# Patient Record
Sex: Male | Born: 2000 | Hispanic: Yes | Marital: Single | Smoking: Never smoker
Health system: Southern US, Community
[De-identification: ages and names within clinical notes are randomized; demographics above are authoritative.]

## PROBLEM LIST (undated history)

## (undated) DIAGNOSIS — J302 Other seasonal allergic rhinitis: Secondary | ICD-10-CM

## (undated) DIAGNOSIS — J45909 Unspecified asthma, uncomplicated: Secondary | ICD-10-CM

## (undated) HISTORY — PX: TESTICLE SURGERY: SHX794

## (undated) HISTORY — PX: LEG SURGERY: SHX1003

---
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
## 2020-05-14 IMAGING — DX DG CHEST 2V
2 series · 2 of 2 positions shown · non-contrast
Comparison: December 27, 2015

CLINICAL DATA: Chest pain

EXAM:
CHEST - 2 VIEW

[chest pa]
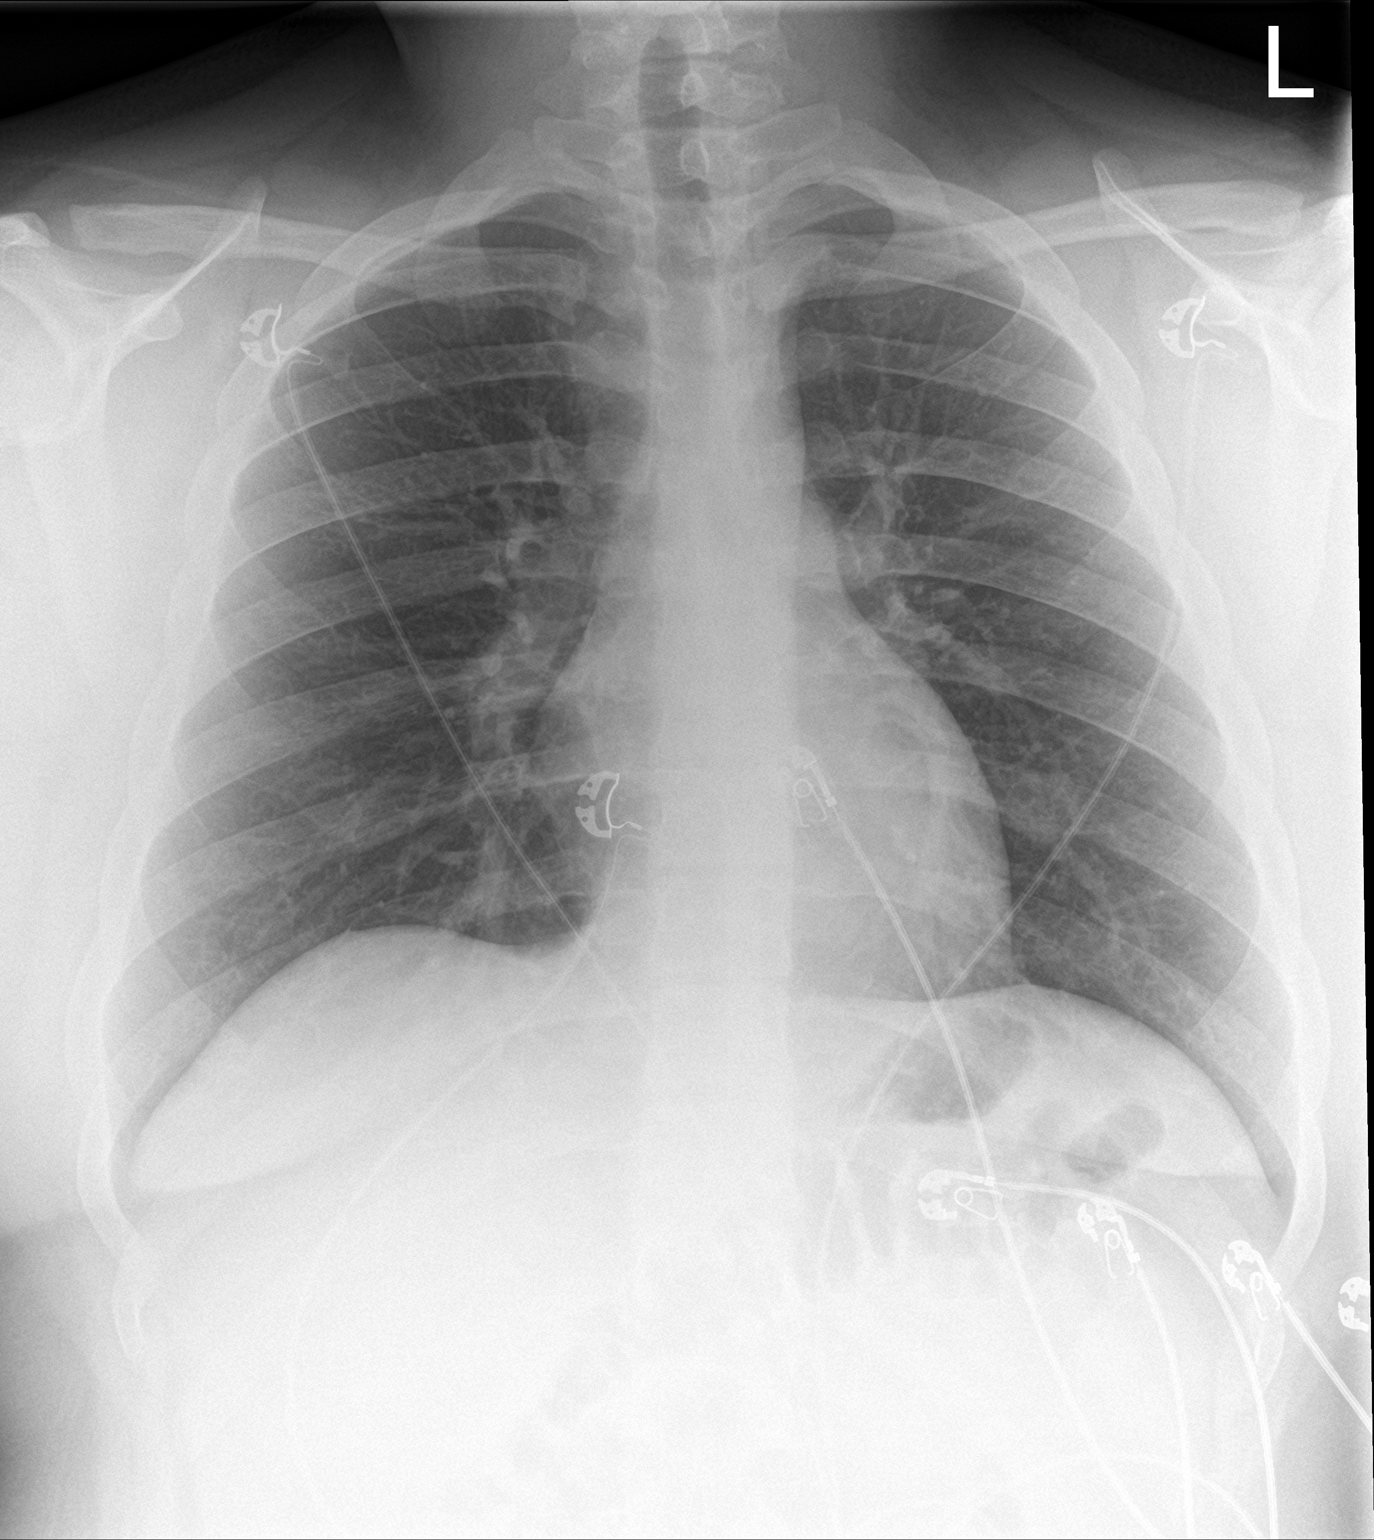

[chest lat]
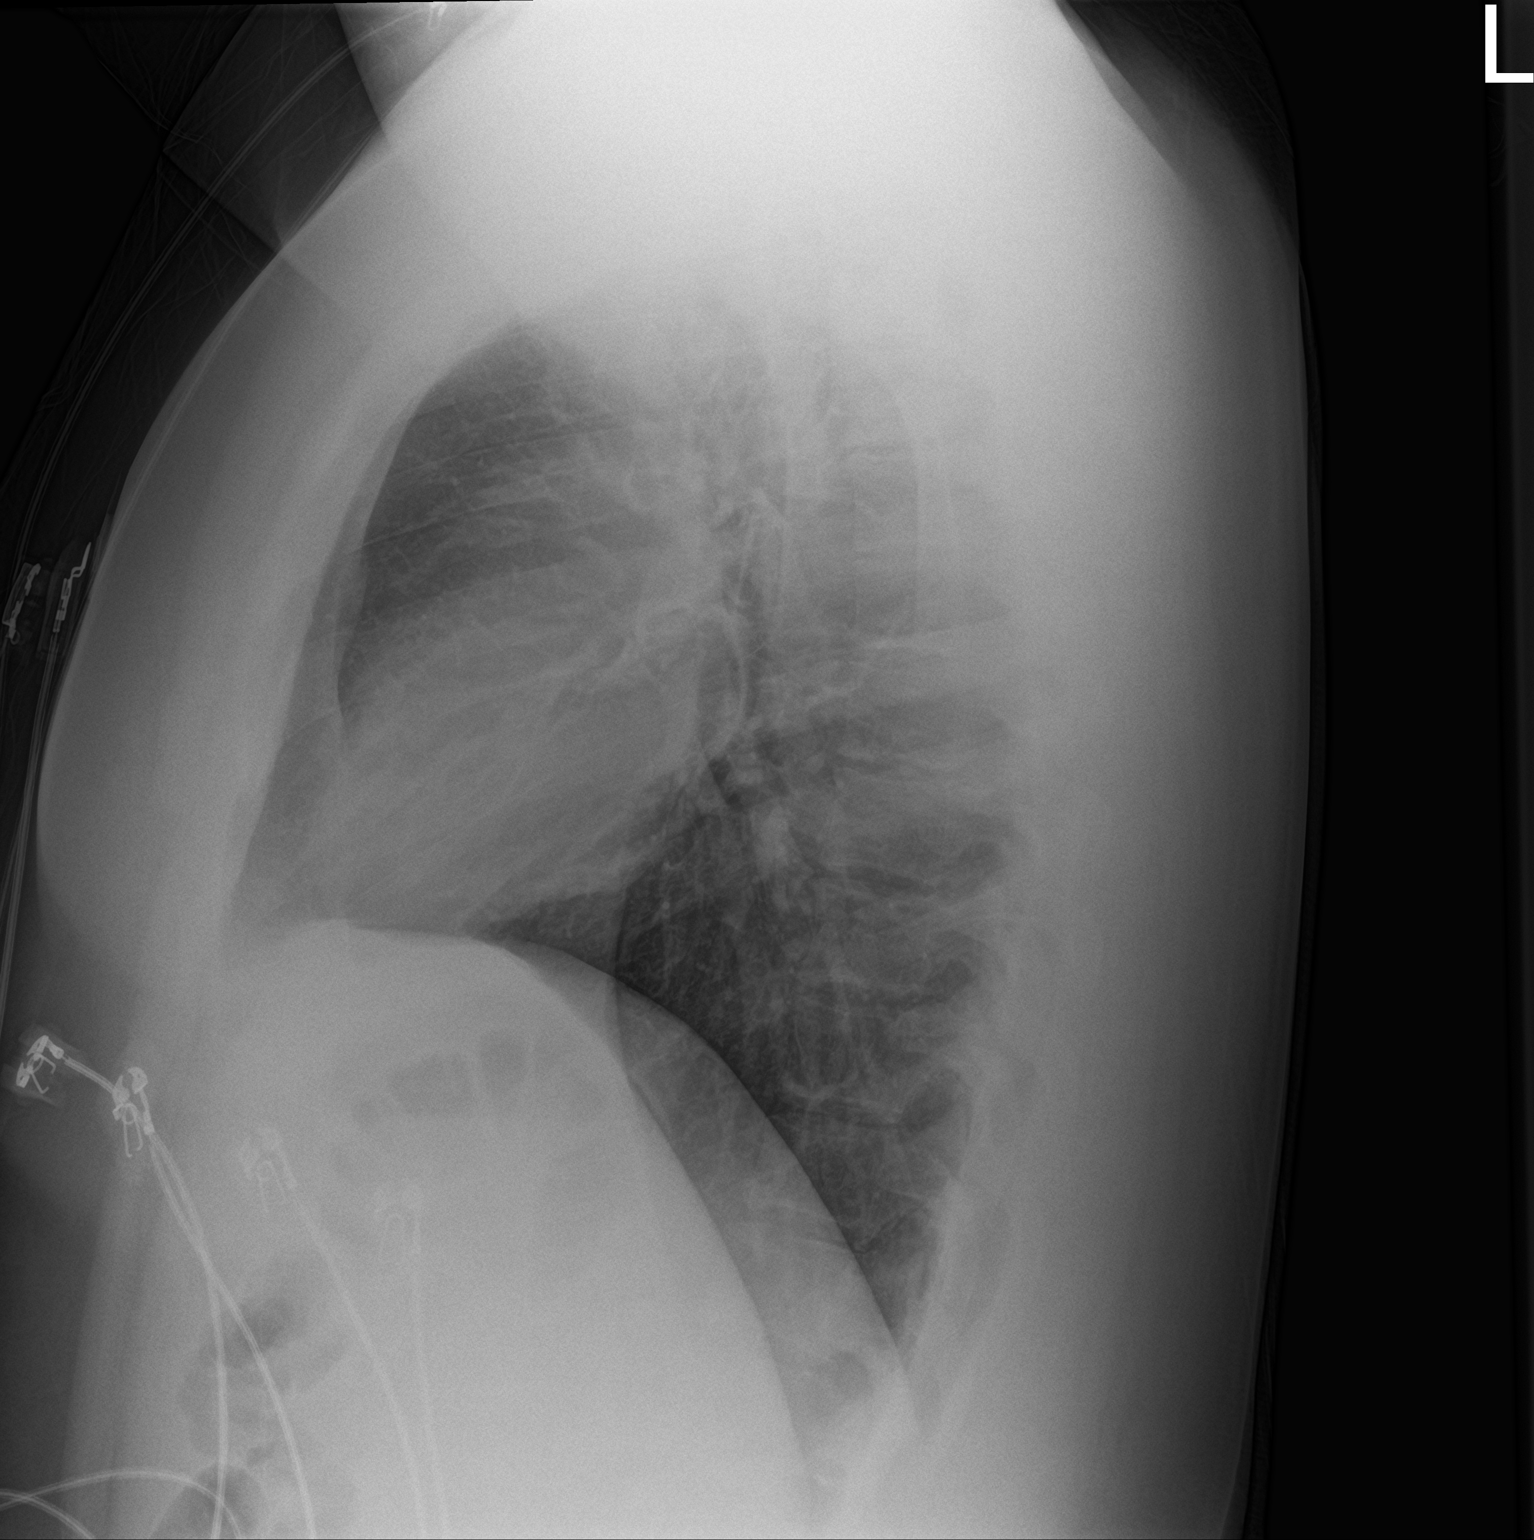

[2 of 2 positions shown; findings below may reference images not displayed]

FINDINGS: Lungs are clear. The heart size and pulmonary vascularity are
normal. No adenopathy. No pneumothorax. No bone lesions.
IMPRESSION: No edema or consolidation.

## 2022-07-06 ENCOUNTER — Emergency Department (HOSPITAL_BASED_OUTPATIENT_CLINIC_OR_DEPARTMENT_OTHER): Payer: Self-pay

## 2022-07-06 ENCOUNTER — Encounter (HOSPITAL_BASED_OUTPATIENT_CLINIC_OR_DEPARTMENT_OTHER): Payer: Self-pay

## 2022-07-06 ENCOUNTER — Emergency Department (HOSPITAL_BASED_OUTPATIENT_CLINIC_OR_DEPARTMENT_OTHER)
Admission: EM | Admit: 2022-07-06 | Discharge: 2022-07-06 | Disposition: A | Discharge status: Home / Self Care | Payer: Self-pay | Attending: Emergency Medicine | Admitting: Emergency Medicine

## 2022-07-06 ENCOUNTER — Other Ambulatory Visit: Payer: Self-pay

## 2022-07-06 DIAGNOSIS — R748 Abnormal levels of other serum enzymes: Secondary | ICD-10-CM | POA: Insufficient documentation

## 2022-07-06 DIAGNOSIS — R1013 Epigastric pain: Secondary | ICD-10-CM | POA: Insufficient documentation

## 2022-07-06 LAB — COMPREHENSIVE METABOLIC PANEL
ALT: 49 U/L — ABNORMAL HIGH (ref 0–44)
AST: 32 U/L (ref 15–41)
Albumin: 4.3 g/dL (ref 3.5–5.0)
Alkaline Phosphatase: 103 U/L (ref 38–126)
Anion gap: 8 (ref 5–15)
BUN: 12 mg/dL (ref 6–20)
CO2: 27 mmol/L (ref 22–32)
Calcium: 9.1 mg/dL (ref 8.9–10.3)
Chloride: 103 mmol/L (ref 98–111)
Creatinine, Ser: 0.76 mg/dL (ref 0.61–1.24)
GFR, Estimated: >60 mL/min (ref >60)
Glucose, Bld: 111 mg/dL — ABNORMAL HIGH (ref 70–99)
Potassium: 3.8 mmol/L (ref 3.5–5.1)
Sodium: 138 mmol/L (ref 135–145)
Total Bilirubin: 0.7 mg/dL (ref 0.3–1.2)
Total Protein: 8 g/dL (ref 6.5–8.1)

## 2022-07-06 LAB — CBC WITH DIFFERENTIAL/PLATELET
Abs Immature Granulocytes: 0.02 10*3/uL (ref 0.00–0.07)
Basophils Absolute: 0 10*3/uL (ref 0.0–0.1)
Basophils Relative: 1 %
Eosinophils Absolute: 0.2 10*3/uL (ref 0.0–0.5)
Eosinophils Relative: 2 %
HCT: 46.6 % (ref 39.0–52.0)
Hemoglobin: 15.4 g/dL (ref 13.0–17.0)
Immature Granulocytes: 0 %
Lymphocytes Relative: 41 %
Lymphs Abs: 3.5 10*3/uL (ref 0.7–4.0)
MCH: 27.5 pg (ref 26.0–34.0)
MCHC: 33 g/dL (ref 30.0–36.0)
MCV: 83.1 fL (ref 80.0–100.0)
Monocytes Absolute: 0.4 10*3/uL (ref 0.1–1.0)
Monocytes Relative: 5 %
Neutro Abs: 4.4 10*3/uL (ref 1.7–7.7)
Neutrophils Relative %: 51 %
Platelets: 333 10*3/uL (ref 150–400)
RBC: 5.61 MIL/uL (ref 4.22–5.81)
RDW: 13.1 % (ref 11.5–15.5)
WBC: 8.6 10*3/uL (ref 4.0–10.5)
nRBC: 0 % (ref 0.0–0.2)

## 2022-07-06 LAB — URINALYSIS, ROUTINE W REFLEX MICROSCOPIC
Bilirubin Urine: NEGATIVE
Glucose, UA: NEGATIVE mg/dL
Hgb urine dipstick: NEGATIVE
Ketones, ur: NEGATIVE mg/dL
Leukocytes,Ua: NEGATIVE
Nitrite: NEGATIVE
Protein, ur: NEGATIVE mg/dL
Specific Gravity, Urine: 1.025 (ref 1.005–1.030)
pH: 7 (ref 5.0–8.0)

## 2022-07-06 LAB — LIPASE, BLOOD: Lipase: 290 U/L — ABNORMAL HIGH (ref 11–51)

## 2022-07-06 MED ORDER — LIDOCAINE VISCOUS HCL 2 % MT SOLN
15.0000 mL | Freq: Once | OROMUCOSAL | Status: AC
  Administered 2022-07-06: 15 mL via ORAL
  Filled 2022-07-06: qty 15

## 2022-07-06 MED ORDER — PANTOPRAZOLE SODIUM 20 MG PO TBEC: 20.0000 mg | DELAYED_RELEASE_TABLET | Freq: Every day | ORAL | 0 refills | Status: AC

## 2022-07-06 MED ORDER — IOHEXOL 300 MG/ML  SOLN
100.0000 mL | Freq: Once | INTRAMUSCULAR | Status: AC | PRN
  Administered 2022-07-06: 100 mL via INTRAVENOUS

## 2022-07-06 MED ORDER — LIDOCAINE VISCOUS HCL 2 % MT SOLN: 15.0000 mL | OROMUCOSAL | 0 refills | Status: AC | PRN

## 2022-07-06 MED ORDER — PANTOPRAZOLE SODIUM 40 MG IV SOLR
40.0000 mg | Freq: Once | INTRAVENOUS | Status: AC
  Administered 2022-07-06: 40 mg via INTRAVENOUS
  Filled 2022-07-06: qty 10

## 2022-07-06 MED ORDER — ALUM & MAG HYDROXIDE-SIMETH 200-200-20 MG/5ML PO SUSP
30.0000 mL | Freq: Once | ORAL | Status: AC
  Administered 2022-07-06: 30 mL via ORAL
  Filled 2022-07-06: qty 30

## 2022-07-06 MED ORDER — SUCRALFATE 1 G PO TABS: 1.0000 g | ORAL_TABLET | Freq: Three times a day (TID) | ORAL | 0 refills | Status: AC

## 2022-07-06 NOTE — ED Triage Notes (Signed)
Reports upper abd pain and indigestion that started last night. Denies n/v.  Reports he got dizzy when it started last night. Denies radiation.

## 2022-07-06 NOTE — ED Notes (Signed)
PO challenge complete patient keep fluids down

## 2022-07-06 NOTE — Discharge Instructions (Addendum)
Gentle diet over the next few days. Start taking the medications as prescribed  Stop taking the Ibuprofen  On the back of the discharge paperwork there is information to establish care with a primary care provider   Follow up with PCP  Return for new or worsening symptom

## 2022-07-06 NOTE — ED Notes (Signed)
Having upper abdominal pain since last night.Denies any n/v. States that he has hemorrhoids States that he has acid reflux sometime .

## 2022-07-06 NOTE — ED Provider Notes (Signed)
[facility] Provider Note   CSN: [id number] Arrival date & time: 07/06/22  1154    History  Chief Complaint  Patient presents with   Abdominal Pain   Heartburn    Walter Jackson is a 21 y.o. male for evaluation of abdominal pain.  Located epigastric region.  Some belching acid reflux.  Chest pain or shortness of breath.  Does state that he uses ibuprofen pretty frequently.  He has known history of hemorrhoids.  No lower abdominal pain.  No fever, vomiting, chest pain, shortness of breath, back pain, no alcohol use.  Does state he has some frequent constipation and when he strains he will have some bright red blood on the toilet paper.  States has been ongoing for quite some time.  No increase in bleeding.  No meds PTA. No prior abd surgeries.  Never had anything like this before.  No pain worse with p.o. intake, no right upper quadrant pain.  HPI     Home Medications Prior to Admission medications   Medication Sig Start Date End Date Taking? Authorizing Provider  lidocaine (XYLOCAINE) 2 % solution Use as directed 15 mLs in the mouth or throat as needed for mouth pain. 07/06/22  Yes Cassia Fein A, PA-C  pantoprazole (PROTONIX) 20 MG tablet Take 1 tablet (20 mg total) by mouth daily. 07/06/22  Yes Josuel Koeppen A, PA-C  sucralfate (CARAFATE) 1 g tablet Take 1 tablet (1 g total) by mouth 4 (four) times daily -  with meals and at bedtime. 07/06/22 08/05/22 Yes Runa Whittingham A, PA-C  albuterol (PROVENTIL HFA;VENTOLIN HFA) 108 (90 Base) MCG/ACT inhaler Inhale 1-2 puffs into the lungs every 6 (six) hours as needed for wheezing or shortness of breath.    [provider]  albuterol (PROVENTIL) (2.5 MG/3ML) 0.083% nebulizer solution Take 2.5 mg by nebulization every 6 (six) hours as needed for wheezing or shortness of breath.    [provider]  cetirizine (ZYRTEC) 10 MG tablet Take 10 mg by mouth daily.    [provider]   doxycycline (VIBRAMYCIN) 100 MG capsule Take 1 capsule (100 mg total) by mouth 2 (two) times daily. 06/05/17   Mikell, Jeani Sow, MD  famotidine (PEPCID) 20 MG tablet Take 1 tablet (20 mg total) by mouth 2 (two) times daily. 09/05/18   Willadean Carol, MD  fluticasone-salmeterol (ADVAIR HFA) [phone] MCG/ACT inhaler Inhale 2 puffs into the lungs 2 (two) times daily as needed (for shortness of breath).     [provider]  guaiFENesin (ROBITUSSIN) 100 MG/5ML liquid Take 5-10 mLs (100-200 mg total) by mouth every 4 (four) hours as needed for cough. 12/27/15   Clayton Bibles, PA-C  ibuprofen (ADVIL,MOTRIN) 600 MG tablet Take 1 tablet (600 mg total) by mouth 3 (three) times daily. 09/05/18   Willadean Carol, MD  ondansetron (ZOFRAN) 4 MG tablet Take 1 tablet (4 mg total) by mouth every 8 (eight) hours as needed for nausea or vomiting. 12/27/15   Clayton Bibles, PA-C      Allergies    Patient has no known allergies.    Review of Systems   Review of Systems  Constitutional: Negative.   HENT: Negative.    Respiratory: Negative.    Cardiovascular: Negative.   Gastrointestinal:  Positive for abdominal pain and nausea. Negative for abdominal distention, anal bleeding, blood in stool, constipation, diarrhea, rectal pain and vomiting.  Genitourinary: Negative.   Musculoskeletal: Negative.   Skin: Negative.   Neurological: Negative.  All other systems reviewed and are negative.   Physical Exam Updated Vital Signs BP (!) 124/54   Pulse 66   Temp 98 F (36.7 C) (Oral)   Resp 18   Ht 5\' 8"  (1.727 m)   Wt 116.6 kg   SpO2 99%   BMI 39.08 kg/m  Physical Exam Vitals and nursing note reviewed.  Constitutional:      General: He is not in acute distress.    Appearance: He is well-developed. He is not ill-appearing, toxic-appearing or diaphoretic.  HENT:     Head: Normocephalic and atraumatic.  Eyes:     Pupils: Pupils are equal, round, and reactive to light.  Cardiovascular:     Rate  and Rhythm: Normal rate and regular rhythm.     Heart sounds: Normal heart sounds.  Pulmonary:     Effort: Pulmonary effort is normal. No respiratory distress.     Breath sounds: Normal breath sounds.  Abdominal:     General: Bowel sounds are normal. There is no distension.     Palpations: Abdomen is soft.     Tenderness: There is abdominal tenderness in the epigastric area.     Hernia: No hernia is present.     Comments: Mild tenderness epigastric region without guarding.  Negative Murphy sign  Musculoskeletal:        General: Normal range of motion.     Cervical back: Normal range of motion and neck supple.  Skin:    General: Skin is warm and dry.  Neurological:     General: No focal deficit present.     Mental Status: He is alert and oriented to person, place, and time.     ED Results / Procedures / Treatments   Labs (all labs ordered are listed, but only abnormal results are displayed) Labs Reviewed  COMPREHENSIVE METABOLIC PANEL - Abnormal; Notable for the following components:      Result Value   Glucose, Bld 111 (*)    ALT 49 (*)    All other components within normal limits  LIPASE, BLOOD - Abnormal; Notable for the following components:   Lipase 290 (*)    All other components within normal limits  CBC WITH DIFFERENTIAL/PLATELET  URINALYSIS, ROUTINE W REFLEX MICROSCOPIC    EKG None  Radiology US Abdomen Limited RUQ (LIVER/GB)  Result Date: 07/06/2022 CLINICAL DATA:  221910 Elevated LFTs 221910 EXAM: ULTRASOUND ABDOMEN LIMITED RIGHT UPPER QUADRANT COMPARISON:  CT from the same day. FINDINGS: Gallbladder: Contracted gallbladder, which limits assessment. No evidence of gallbladder wall thickening. Negative Murphy sign per the technologist. No gallstones identified. Common bile duct: Diameter: 2.6 mm, within normal limits. Liver: No focal lesion identified. Diffusely echogenic liver with sparing at the gallbladder fossa. Portal vein is patent on color Doppler imaging  with normal direction of blood flow towards the liver. IMPRESSION: Diffusely echogenic liver, which is nonspecific but most commonly related to hepatic steatosis. Electronically Signed   By: Margaretha Sheffield M.D.   On: 07/06/2022 15:27   CT ABDOMEN PELVIS W CONTRAST  Result Date: 07/06/2022 CLINICAL DATA:  Epigastric pain, upper abdominal pain, indigestion, onset of symptoms last night, dizziness, elevated lipase EXAM: CT ABDOMEN AND PELVIS WITH CONTRAST TECHNIQUE: Multidetector CT imaging of the abdomen and pelvis was performed using the standard protocol following bolus administration of intravenous contrast. RADIATION DOSE REDUCTION: This exam was performed according to the departmental dose-optimization program which includes automated exposure control, adjustment of the mA and/or kV according to patient size and/or use  of iterative reconstruction technique. CONTRAST:  188mL OMNIPAQUE IOHEXOL 300 MG/ML  SOLN COMPARISON:  None Available. FINDINGS: Lower chest: Lung bases clear Hepatobiliary: Contracted gallbladder.  Liver normal appearance. Pancreas: Normal appearance. No CT evidence of pancreatitis, fluid collection, or mass. Spleen: Normal appearance Adrenals/Urinary Tract: Adrenal glands, kidneys, ureters, and bladder normal appearance Stomach/Bowel: Normal appendix. Stomach and bowel loops normal appearance. Vascular/Lymphatic: Vascular structures patent. No adenopathy. Scattered normal size mesenteric lymph nodes and retroperitoneal nodes. Reproductive: Unremarkable prostate gland and seminal vesicles Other: No free air or free fluid.  No hernia. Musculoskeletal: Osseous structures unremarkable. IMPRESSION: No acute intra-abdominal or intrapelvic abnormalities. Electronically Signed   By: Lavonia Dana M.D.   On: 07/06/2022 14:39   DG Abdomen Acute W/Chest  Result Date: 07/06/2022 CLINICAL DATA:  Pain EXAM: DG ABDOMEN ACUTE WITH 1 VIEW CHEST COMPARISON:  Chest x-ray dated September 05, 2018 FINDINGS:  There is no evidence of dilated bowel loops or free intraperitoneal air. No radiopaque calculi or other significant radiographic abnormality is seen. Heart size and mediastinal contours are within normal limits. Both lungs are clear. IMPRESSION: Negative abdominal radiographs.  No acute cardiopulmonary disease. Electronically Signed   By: Yetta Glassman M.D.   On: 07/06/2022 13:37    Procedures Procedures    Medications Ordered in ED Medications  pantoprazole (PROTONIX) injection 40 mg (40 mg Intravenous Given 07/06/22 1239)  alum & mag hydroxide-simeth (MAALOX/MYLANTA) 200-200-20 MG/5ML suspension 30 mL (30 mLs Oral Given 07/06/22 1239)    And  lidocaine (XYLOCAINE) 2 % viscous mouth solution 15 mL (15 mLs Oral Given 07/06/22 1239)  iohexol (OMNIPAQUE) 300 MG/ML solution 100 mL (100 mLs Intravenous Contrast Given 07/06/22 1405)    ED Course/ Medical Decision Making/ A&P    21 year old here for evaluation of epigastric abdominal pain which began yesterday.  Associated reflux and belching.  Pain reproducible on exam.  He does note chronic NSAID use, ibuprofen.  No back pain, history of pancreatitis, chronic EtOH use.  Does note that he has hemorrhoids and occasionally has some bright red blood on the toilet paper when he strains with his constipation.  He denies any large-volume melena or bright red per rectum.  We will plan on labs, imaging and reassess, suspect NSAID induced gastritis  Labs and imaging personally viewed and interpreted:  CBC without leukocytosis UA negative for infection Metabolic panel normal BUN, ALT 49 Lipase 290 [place] consistent with hepatic steatosis CT AP without pancreatitis or other significant abnormality  Patient reassessed.  Feels better with meds here in ED.  Pending ultrasound  Patient reassessed.   I discussed his labs and imaging.  Have an elevated lipase however imaging not consistent with pancreatitis.  Given his known history of NSAID use, epigastric  pain we will treat as such.  He was encouraged cessation of all NSAID products or anything containing ASA. Normal BUN, denies any large-volume melena or bright blood per rectum.  Feels significantly improved with medication here in the emergency department and he is tolerating p.o. intake.  Discussed gentle diet over the next few days, close follow-up outpatient.  He is agreeable.  He will return for new or worsening symptoms.  Patient is nontoxic, nonseptic appearing, in no apparent distress.  Patient's pain and other symptoms adequately managed in emergency department.  Fluid bolus given.  Labs, imaging and vitals reviewed.  Patient does not meet the SIRS or Sepsis criteria.  On repeat exam patient does not have a surgical abdomin and there are no peritoneal signs.  No indication of appendicitis, bowel obstruction, bowel perforation, cholecystitis, diverticulitis, acute GI bleed.  The patient has been appropriately medically screened and/or stabilized in the ED. I have low suspicion for any other emergent medical condition which would require further screening, evaluation or treatment in the ED or require inpatient management.  Patient is hemodynamically stable and in no acute distress.  Patient able to ambulate in department prior to ED.  Evaluation does not show acute pathology that would require ongoing or additional emergent interventions while in the emergency department or further inpatient treatment.  I have discussed the diagnosis with the patient and answered all questions.  Pain is been managed while in the emergency department and patient has no further complaints prior to discharge.  Patient is comfortable with plan discussed in room and is stable for discharge at this time.  I have discussed strict return precautions for returning to the emergency department.  Patient was encouraged to follow-up with PCP/specialist refer to at discharge.                            Medical Decision  Making Amount and/or Complexity of Data Reviewed Independent Historian: parent External Data Reviewed: labs, radiology and notes. Labs: ordered. Decision-making details documented in ED Course. Radiology: ordered and independent interpretation performed. Decision-making details documented in ED Course.  Risk OTC drugs. Prescription drug management. Parenteral controlled substances. Decision regarding hospitalization. Diagnosis or treatment significantly limited by social determinants of health.          Final Clinical Impression(s) / ED Diagnoses Final diagnoses:  Epigastric pain  Elevated lipase    Rx / DC Orders ED Discharge Orders          Ordered    pantoprazole (PROTONIX) 20 MG tablet  Daily        07/06/22 1611    sucralfate (CARAFATE) 1 g tablet  3 times daily with meals & bedtime        07/06/22 1611    lidocaine (XYLOCAINE) 2 % solution  As needed        07/06/22 1611              Kamsiyochukwu Buist A, PA-C 07/06/22 1618    Rancour, Annie Main, MD 07/06/22 1644

## 2022-07-06 NOTE — ED Notes (Signed)
Urine sample taken to lab. 

## 2023-01-30 ENCOUNTER — Other Ambulatory Visit: Payer: Self-pay

## 2023-01-30 ENCOUNTER — Emergency Department (HOSPITAL_COMMUNITY)
Admission: EM | Admit: 2023-01-30 | Discharge: 2023-01-30 | Disposition: A | Discharge status: Home / Self Care | Payer: BC Managed Care – PPO | Attending: Emergency Medicine | Admitting: Emergency Medicine

## 2023-01-30 ENCOUNTER — Emergency Department (HOSPITAL_COMMUNITY): Payer: BC Managed Care – PPO

## 2023-01-30 DIAGNOSIS — M545 Low back pain, unspecified: Secondary | ICD-10-CM | POA: Insufficient documentation

## 2023-01-30 LAB — CBC WITH DIFFERENTIAL/PLATELET
Abs Immature Granulocytes: 0.03 10*3/uL (ref 0.00–0.07)
Basophils Absolute: 0 10*3/uL (ref 0.0–0.1)
Basophils Relative: 0 %
Eosinophils Absolute: 0.1 10*3/uL (ref 0.0–0.5)
Eosinophils Relative: 1 %
HCT: 46.7 % (ref 39.0–52.0)
Hemoglobin: 15.1 g/dL (ref 13.0–17.0)
Immature Granulocytes: 0 %
Lymphocytes Relative: 41 %
Lymphs Abs: 2.9 10*3/uL (ref 0.7–4.0)
MCH: 27.3 pg (ref 26.0–34.0)
MCHC: 32.3 g/dL (ref 30.0–36.0)
MCV: 84.4 fL (ref 80.0–100.0)
Monocytes Absolute: 0.3 10*3/uL (ref 0.1–1.0)
Monocytes Relative: 5 %
Neutro Abs: 3.8 10*3/uL (ref 1.7–7.7)
Neutrophils Relative %: 53 %
Platelets: 290 10*3/uL (ref 150–400)
RBC: 5.53 MIL/uL (ref 4.22–5.81)
RDW: 13.2 % (ref 11.5–15.5)
WBC: 7.1 10*3/uL (ref 4.0–10.5)
nRBC: 0 % (ref 0.0–0.2)

## 2023-01-30 LAB — COMPREHENSIVE METABOLIC PANEL
ALT: 39 U/L (ref 0–44)
AST: 26 U/L (ref 15–41)
Albumin: 4.2 g/dL (ref 3.5–5.0)
Alkaline Phosphatase: 102 U/L (ref 38–126)
Anion gap: 6 (ref 5–15)
BUN: 11 mg/dL (ref 6–20)
CO2: 24 mmol/L (ref 22–32)
Calcium: 9.1 mg/dL (ref 8.9–10.3)
Chloride: 107 mmol/L (ref 98–111)
Creatinine, Ser: 0.75 mg/dL (ref 0.61–1.24)
GFR, Estimated: >60 mL/min (ref >60)
Glucose, Bld: 112 mg/dL — ABNORMAL HIGH (ref 70–99)
Potassium: 3.5 mmol/L (ref 3.5–5.1)
Sodium: 137 mmol/L (ref 135–145)
Total Bilirubin: 0.6 mg/dL (ref 0.3–1.2)
Total Protein: 7.3 g/dL (ref 6.5–8.1)

## 2023-01-30 LAB — URINALYSIS, ROUTINE W REFLEX MICROSCOPIC
Bilirubin Urine: NEGATIVE
Glucose, UA: NEGATIVE mg/dL
Hgb urine dipstick: NEGATIVE
Ketones, ur: NEGATIVE mg/dL
Leukocytes,Ua: NEGATIVE
Nitrite: NEGATIVE
Protein, ur: NEGATIVE mg/dL
Specific Gravity, Urine: 1.014 (ref 1.005–1.030)
pH: 6 (ref 5.0–8.0)

## 2023-01-30 LAB — LIPASE, BLOOD: Lipase: 88 U/L — ABNORMAL HIGH (ref 11–51)

## 2023-01-30 MED ORDER — NAPROXEN 500 MG PO TABS: 500.0000 mg | ORAL_TABLET | Freq: Two times a day (BID) | ORAL | 0 refills | Status: AC

## 2023-01-30 MED ORDER — LIDOCAINE 5 % EX PTCH: 1.0000 | MEDICATED_PATCH | CUTANEOUS | 0 refills | Status: AC

## 2023-01-30 MED ORDER — MORPHINE SULFATE (PF) 4 MG/ML IV SOLN
4.0000 mg | Freq: Once | INTRAVENOUS | Status: AC
  Administered 2023-01-30: 4 mg via INTRAVENOUS
  Filled 2023-01-30: qty 1

## 2023-01-30 MED ORDER — KETOROLAC TROMETHAMINE 15 MG/ML IJ SOLN
15.0000 mg | Freq: Once | INTRAMUSCULAR | Status: AC
  Administered 2023-01-30: 15 mg via INTRAVENOUS
  Filled 2023-01-30: qty 1

## 2023-01-30 MED ORDER — METHOCARBAMOL 500 MG PO TABS: 500.0000 mg | ORAL_TABLET | Freq: Two times a day (BID) | ORAL | 0 refills | Status: AC

## 2023-01-30 NOTE — ED Provider Notes (Signed)
[place] EMERGENCY DEPARTMENT AT [facility] Provider Note   CSN: [id number] Arrival date & time: 01/30/23  1457     History  Chief Complaint  Patient presents with   Back Pain    Walter Jackson is a 22 y.o. male with no significant past medical history here for evaluation of left lower back pain located left paraspinal region into flank.  Dysuria hematuria.  Worse with movement.  Improves with rest.  No radicular symptoms.  No history of IVDU, bowel or bladder incontinence, saddle paresthesia.  Taking muscle relaxers and anti-inflammatories without relief.  HPI     Home Medications Prior to Admission medications   Medication Sig Start Date End Date Taking? Authorizing Provider  lidocaine (LIDODERM) 5 % Place 1 patch onto the skin daily. Remove & Discard patch within 12 hours or as directed by MD 01/30/23  Yes Lorianna Spadaccini A, PA-C  methocarbamol (ROBAXIN) 500 MG tablet Take 1 tablet (500 mg total) by mouth 2 (two) times daily. 01/30/23  Yes Reis Pienta A, PA-C  naproxen (NAPROSYN) 500 MG tablet Take 1 tablet (500 mg total) by mouth 2 (two) times daily. 01/30/23  Yes Airiana Elman A, PA-C  albuterol (PROVENTIL HFA;VENTOLIN HFA) 108 (90 Base) MCG/ACT inhaler Inhale 1-2 puffs into the lungs every 6 (six) hours as needed for wheezing or shortness of breath.    [provider]  albuterol (PROVENTIL) (2.5 MG/3ML) 0.083% nebulizer solution Take 2.5 mg by nebulization every 6 (six) hours as needed for wheezing or shortness of breath.    [provider]  cetirizine (ZYRTEC) 10 MG tablet Take 10 mg by mouth daily.    [provider]  doxycycline (VIBRAMYCIN) 100 MG capsule Take 1 capsule (100 mg total) by mouth 2 (two) times daily. 06/05/17   Mikell, Antionette Poles, MD  famotidine (PEPCID) 20 MG tablet Take 1 tablet (20 mg total) by mouth 2 (two) times daily. 09/05/18   Vicki Mallet, MD  fluticasone-salmeterol (ADVAIR HFA) [phone] MCG/ACT  inhaler Inhale 2 puffs into the lungs 2 (two) times daily as needed (for shortness of breath).     [provider]  guaiFENesin (ROBITUSSIN) 100 MG/5ML liquid Take 5-10 mLs (100-200 mg total) by mouth every 4 (four) hours as needed for cough. 12/27/15   Trixie Dredge, PA-C  ibuprofen (ADVIL,MOTRIN) 600 MG tablet Take 1 tablet (600 mg total) by mouth 3 (three) times daily. 09/05/18   Vicki Mallet, MD  lidocaine (XYLOCAINE) 2 % solution Use as directed 15 mLs in the mouth or throat as needed for mouth pain. 07/06/22   Tesla Bochicchio A, PA-C  ondansetron (ZOFRAN) 4 MG tablet Take 1 tablet (4 mg total) by mouth every 8 (eight) hours as needed for nausea or vomiting. 12/27/15   Trixie Dredge, PA-C  pantoprazole (PROTONIX) 20 MG tablet Take 1 tablet (20 mg total) by mouth daily. 07/06/22   Vilda Zollner A, PA-C  sucralfate (CARAFATE) 1 g tablet Take 1 tablet (1 g total) by mouth 4 (four) times daily -  with meals and at bedtime. 07/06/22 08/05/22  Jase Reep A, PA-C      Allergies    Patient has no known allergies.    Review of Systems   Review of Systems  Constitutional: Negative.   HENT: Negative.    Respiratory: Negative.    Cardiovascular: Negative.   Gastrointestinal: Negative.   Genitourinary: Negative.   Musculoskeletal:  Positive for back pain.  Neurological: Negative.   All other systems  reviewed and are negative.   Physical Exam Updated Vital Signs BP 126/74 (BP Location: Right Arm)   Pulse 92   Temp 98.6 F (37 C) (Oral)   Resp 18   Ht 5\' 8"  (1.727 m)   Wt 117.9 kg   SpO2 95%   BMI 39.53 kg/m  Physical Exam Vitals and nursing note reviewed.  Constitutional:      General: He is not in acute distress.    Appearance: He is well-developed. He is not ill-appearing, toxic-appearing or diaphoretic.  HENT:     Head: Normocephalic and atraumatic.     Nose: Nose normal.     Mouth/Throat:     Mouth: Mucous membranes are moist.  Eyes:     Pupils: Pupils are  equal, round, and reactive to light.  Cardiovascular:     Rate and Rhythm: Normal rate and regular rhythm.     Pulses: Normal pulses.     Heart sounds: Normal heart sounds.  Pulmonary:     Effort: Pulmonary effort is normal. No respiratory distress.     Breath sounds: Normal breath sounds.  Abdominal:     General: Bowel sounds are normal. There is no distension.     Palpations: Abdomen is soft. There is no mass.     Tenderness: There is abdominal tenderness. There is no right CVA tenderness, left CVA tenderness, guarding or rebound.     Hernia: No hernia is present.     Comments: Left flank, paraspinal tenderness.  No overlying skin changes, no erythema or warmth  Musculoskeletal:        General: Tenderness present. No swelling, deformity or signs of injury. Normal range of motion.     Cervical back: Normal range of motion and neck supple.     Right lower leg: No edema.     Left lower leg: No edema.     Comments: Straight leg raise bilaterally.  No midline step-off.  Compartments soft  Skin:    General: Skin is warm and dry.     Capillary Refill: Capillary refill takes less than 2 seconds.     Comments: No edema, erythema, warmth.  Neurological:     General: No focal deficit present.     Mental Status: He is alert and oriented to person, place, and time.     Comments: Ambulatory Equal strength Intact sensation Ambulatory without ataxia     ED Results / Procedures / Treatments   Labs (all labs ordered are listed, but only abnormal results are displayed) Labs Reviewed  URINALYSIS, ROUTINE W REFLEX MICROSCOPIC - Abnormal; Notable for the following components:      Result Value   Color, Urine STRAW (*)    All other components within normal limits  COMPREHENSIVE METABOLIC PANEL - Abnormal; Notable for the following components:   Glucose, Bld 112 (*)    All other components within normal limits  LIPASE, BLOOD - Abnormal; Notable for the following components:   Lipase 88 (*)     All other components within normal limits  CBC WITH DIFFERENTIAL/PLATELET    EKG None  Radiology CT L-SPINE NO CHARGE  Result Date: 01/30/2023 CLINICAL DATA:  Low back pain. EXAM: CT LUMBAR SPINE WITHOUT CONTRAST TECHNIQUE: Multidetector CT imaging of the lumbar spine was performed without intravenous contrast administration. Multiplanar CT image reconstructions were also generated. RADIATION DOSE REDUCTION: This exam was performed according to the departmental dose-optimization program which includes automated exposure control, adjustment of the mA and/or kV according to patient size  and/or use of iterative reconstruction technique. COMPARISON:  CT abdomen and pelvis 07/06/2022 FINDINGS: Segmentation: 5 lumbar type vertebrae. Alignment: Trace retrolisthesis of L5 on S1. Vertebrae: No acute fracture or suspicious osseous lesion. Paraspinal and other soft tissues: No acute abnormality in the paraspinal soft tissues. Remainder of the abdomen and pelvis reported separately. Disc levels: Preserved disc space heights. Patent spinal canal and neural foramina. IMPRESSION: No acute osseous abnormality or significant degenerative changes. Electronically Signed   By: Sebastian Ache M.D.   On: 01/30/2023 16:44   CT Renal Stone Study  Result Date: 01/30/2023 CLINICAL DATA:  Abdominal/flank pain, stone suspected. EXAM: CT ABDOMEN AND PELVIS WITHOUT CONTRAST TECHNIQUE: Multidetector CT imaging of the abdomen and pelvis was performed following the standard protocol without IV contrast. RADIATION DOSE REDUCTION: This exam was performed according to the departmental dose-optimization program which includes automated exposure control, adjustment of the mA and/or kV according to patient size and/or use of iterative reconstruction technique. COMPARISON:  CT abdomen and pelvis 07/06/2022 FINDINGS: Lower chest: Clear lung bases. Hepatobiliary: No focal liver abnormality is seen. No gallstones, gallbladder wall thickening, or  biliary dilatation. Pancreas: Unremarkable. Spleen: Unremarkable. Adrenals/Urinary Tract: Unremarkable adrenal glands. No renal calculi or hydronephrosis. Unremarkable bladder. Stomach/Bowel: The stomach is unremarkable. There is no evidence of bowel obstruction or inflammation. The appendix is unremarkable. Vascular/Lymphatic: Normal caliber of the abdominal aorta. Mildly prominent subcentimeter short axis mesenteric lymph nodes, similar to the prior study. Reproductive: Unremarkable prostate. Other: No ascites or pneumoperitoneum. Musculoskeletal: No acute osseous abnormality or suspicious osseous lesion. Lumbar spine reported separately. IMPRESSION: No acute abnormality identified in the abdomen or pelvis. Electronically Signed   By: Sebastian Ache M.D.   On: 01/30/2023 16:40   DG Lumbar Spine Complete  Result Date: 01/30/2023 CLINICAL DATA:  Lumbar pain since yesterday EXAM: LUMBAR SPINE - COMPLETE 4+ VIEW COMPARISON:  None Available. FINDINGS: Five non-rib-bearing lumbar vertebra. Osseous mineralization normal for technique. Vertebral body and disc space heights maintained. No fracture, subluxation, bone destruction, or spondylolysis. SI joints preserved. IMPRESSION: No acute osseous abnormalities. Electronically Signed   By: Ulyses Southward M.D.   On: 01/30/2023 15:58    Procedures Procedures    Medications Ordered in ED Medications  morphine (PF) 4 MG/ML injection 4 mg (4 mg Intravenous Given 01/30/23 1702)  ketorolac (TORADOL) 15 MG/ML injection 15 mg (15 mg Intravenous Given 01/30/23 1702)    ED Course/ Medical Decision Making/ A&P    22 year old here for evaluation of left flank pain, worse with movement, better with rest.  No recent traumatic injuries.  No chest pain, shortness of breath or cough.  No urinary complaints.  No hematuria.  History of degrees bilaterally.  Tenderness to left flank however no rebound or guarding.  Some mild left paraspinal tenderness at lumbar region as well.  No  history of IVDU, bowel or bladder incontinence, saddle paresthesia, fever, malignancy.  No clinical evidence of VTE on exam, equal pulses bilaterally low suspicion for ischemia.  Labs and imaging personally viewed and interpreted:  CBC without leukocytosis Metabolic panel significant Lipase 88 UA negative for infection, blood CT scan negative for acute process  Discussed results with patient.  Suspect musculoskeletal etiology such as spasm.  Low suspicion for infectious process, acute neurosurgical, intra-abdominal or thoracic emergency.  Will treat symptomatically, have him follow-up outpatient.  The patient has been appropriately medically screened and/or stabilized in the ED. I have low suspicion for any other emergent medical condition which would require further screening, evaluation  or treatment in the ED or require inpatient management.  Patient is hemodynamically stable and in no acute distress.  Patient able to ambulate in department prior to ED.  Evaluation does not show acute pathology that would require ongoing or additional emergent interventions while in the emergency department or further inpatient treatment.  I have discussed the diagnosis with the patient and answered all questions.  Pain is been managed while in the emergency department and patient has no further complaints prior to discharge.  Patient is comfortable with plan discussed in room and is stable for discharge at this time.  I have discussed strict return precautions for returning to the emergency department.  Patient was encouraged to follow-up with PCP/specialist refer to at discharge.                              Medical Decision Making Amount and/or Complexity of Data Reviewed Independent Historian: parent External Data Reviewed: labs, radiology and notes. Labs: ordered. Decision-making details documented in ED Course. Radiology: ordered.  Risk OTC drugs. Prescription drug management. Parenteral  controlled substances. Decision regarding hospitalization. Diagnosis or treatment significantly limited by social determinants of health.           Final Clinical Impression(s) / ED Diagnoses Final diagnoses:  Acute left-sided low back pain without sciatica    Rx / DC Orders ED Discharge Orders          Ordered    methocarbamol (ROBAXIN) 500 MG tablet  2 times daily        01/30/23 1757    naproxen (NAPROSYN) 500 MG tablet  2 times daily        01/30/23 1757    lidocaine (LIDODERM) 5 %  Every 24 hours        01/30/23 1757              Sloan Takagi A, PA-C 01/30/23 1916    Melene Plan, DO 01/30/23 1953

## 2023-01-30 NOTE — ED Triage Notes (Signed)
Pt arrived via POV. C/o lumbar pain beginning yesterday. No associated injury.

## 2023-01-30 NOTE — Discharge Instructions (Addendum)
Your labs and imaging were reassuring  Take the medication as prescribed
